# Patient Record
Sex: Female | Born: 1955
Health system: Southern US, Community
[De-identification: ages and names within clinical notes are randomized; demographics above are authoritative.]

## PROBLEM LIST (undated history)

## (undated) DIAGNOSIS — K602 Anal fissure, unspecified: Secondary | ICD-10-CM

## (undated) DIAGNOSIS — I10 Essential (primary) hypertension: Secondary | ICD-10-CM

## (undated) DIAGNOSIS — D219 Benign neoplasm of connective and other soft tissue, unspecified: Secondary | ICD-10-CM

## (undated) DIAGNOSIS — D649 Anemia, unspecified: Secondary | ICD-10-CM

## (undated) DIAGNOSIS — R928 Other abnormal and inconclusive findings on diagnostic imaging of breast: Secondary | ICD-10-CM

## (undated) HISTORY — DX: Anal fissure, unspecified: K60.2

## (undated) HISTORY — DX: Benign neoplasm of connective and other soft tissue, unspecified: D21.9

## (undated) HISTORY — DX: Essential (primary) hypertension: I10

## (undated) HISTORY — PX: DILATION AND CURETTAGE OF UTERUS: SHX78

## (undated) HISTORY — DX: Anemia, unspecified: D64.9

## (undated) HISTORY — DX: Other abnormal and inconclusive findings on diagnostic imaging of breast: R92.8

---

## 1999-06-05 ENCOUNTER — Encounter: Payer: Self-pay | Admitting: Internal Medicine

## 1999-06-05 ENCOUNTER — Ambulatory Visit (HOSPITAL_COMMUNITY): Admission: RE | Admit: 1999-06-05 | Discharge: 1999-06-05 | Payer: Self-pay | Admitting: Internal Medicine

## 1999-08-13 ENCOUNTER — Other Ambulatory Visit: Admission: RE | Admit: 1999-08-13 | Discharge: 1999-08-13 | Payer: Self-pay | Admitting: Gynecology

## 1999-08-14 ENCOUNTER — Other Ambulatory Visit: Admission: RE | Admit: 1999-08-14 | Discharge: 1999-08-14 | Payer: Self-pay | Admitting: Gynecology

## 1999-08-14 ENCOUNTER — Encounter (INDEPENDENT_AMBULATORY_CARE_PROVIDER_SITE_OTHER): Payer: Self-pay | Admitting: Specialist

## 2003-08-31 ENCOUNTER — Other Ambulatory Visit: Admission: RE | Admit: 2003-08-31 | Discharge: 2003-08-31 | Payer: Self-pay | Admitting: Obstetrics and Gynecology

## 2003-09-19 ENCOUNTER — Encounter: Admission: RE | Admit: 2003-09-19 | Discharge: 2003-09-19 | Payer: Self-pay | Admitting: Obstetrics and Gynecology

## 2004-09-19 ENCOUNTER — Other Ambulatory Visit: Admission: RE | Admit: 2004-09-19 | Discharge: 2004-09-19 | Payer: Self-pay | Admitting: Obstetrics and Gynecology

## 2004-10-15 ENCOUNTER — Encounter: Admission: RE | Admit: 2004-10-15 | Discharge: 2004-10-15 | Payer: Self-pay | Admitting: Obstetrics and Gynecology

## 2005-08-27 ENCOUNTER — Other Ambulatory Visit: Admission: RE | Admit: 2005-08-27 | Discharge: 2005-08-27 | Payer: Self-pay | Admitting: Obstetrics and Gynecology

## 2005-10-16 ENCOUNTER — Encounter: Admission: RE | Admit: 2005-10-16 | Discharge: 2005-10-16 | Payer: Self-pay | Admitting: Obstetrics and Gynecology

## 2005-11-21 ENCOUNTER — Encounter: Admission: RE | Admit: 2005-11-21 | Discharge: 2005-11-21 | Payer: Self-pay | Admitting: Obstetrics and Gynecology

## 2006-06-19 ENCOUNTER — Other Ambulatory Visit: Admission: RE | Admit: 2006-06-19 | Discharge: 2006-06-19 | Payer: Self-pay | Admitting: Obstetrics and Gynecology

## 2006-12-08 ENCOUNTER — Encounter: Admission: RE | Admit: 2006-12-08 | Discharge: 2006-12-08 | Payer: Self-pay | Admitting: Obstetrics and Gynecology

## 2008-09-22 ENCOUNTER — Encounter: Admission: RE | Admit: 2008-09-22 | Discharge: 2008-09-22 | Payer: Self-pay | Admitting: Obstetrics and Gynecology

## 2010-10-04 ENCOUNTER — Encounter: Admission: RE | Admit: 2010-10-04 | Discharge: 2010-10-04 | Payer: Self-pay | Admitting: Obstetrics and Gynecology

## 2010-12-09 ENCOUNTER — Encounter: Payer: Self-pay | Admitting: Obstetrics and Gynecology

## 2011-10-28 ENCOUNTER — Other Ambulatory Visit: Payer: Self-pay | Admitting: Obstetrics and Gynecology

## 2011-10-28 DIAGNOSIS — Z1231 Encounter for screening mammogram for malignant neoplasm of breast: Secondary | ICD-10-CM

## 2011-11-22 ENCOUNTER — Ambulatory Visit
Admission: RE | Admit: 2011-11-22 | Discharge: 2011-11-22 | Disposition: A | Discharge status: Home / Self Care | Payer: 59 | Source: Ambulatory Visit | Attending: Obstetrics and Gynecology | Admitting: Obstetrics and Gynecology

## 2011-11-22 DIAGNOSIS — Z1231 Encounter for screening mammogram for malignant neoplasm of breast: Secondary | ICD-10-CM

## 2012-10-27 ENCOUNTER — Ambulatory Visit: Payer: Self-pay | Admitting: Obstetrics and Gynecology

## 2012-11-24 ENCOUNTER — Encounter: Payer: Self-pay | Admitting: Sports Medicine

## 2012-11-24 ENCOUNTER — Ambulatory Visit (INDEPENDENT_AMBULATORY_CARE_PROVIDER_SITE_OTHER): Payer: 59 | Admitting: Sports Medicine

## 2012-11-24 VITALS — BP 133/97 | HR 84 | Ht 66.0 in | Wt 200.0 lb

## 2012-11-24 DIAGNOSIS — M7711 Lateral epicondylitis, right elbow: Secondary | ICD-10-CM | POA: Insufficient documentation

## 2012-11-24 DIAGNOSIS — M7710 Lateral epicondylitis, unspecified elbow: Secondary | ICD-10-CM

## 2012-11-24 DIAGNOSIS — M778 Other enthesopathies, not elsewhere classified: Secondary | ICD-10-CM | POA: Insufficient documentation

## 2012-11-24 DIAGNOSIS — M65839 Other synovitis and tenosynovitis, unspecified forearm: Secondary | ICD-10-CM

## 2012-11-24 NOTE — Patient Instructions (Addendum)
Do the exercises we discussed 3 times per day. Wear your wrist splint for an hour 3 times per day and at any time you are doing something involving a lot of wrist activity (such as tennis). Come back to see [place] in 6 weeks.

## 2012-11-24 NOTE — Progress Notes (Signed)
Patient ID: Lauren Vazquez, female   DOB: 09/01/1956, 57 y.o.   MRN: [id number] Subjective: The patient is a 57 y.o. year old female who presents today for intermittent right wrist and elbow pain.  Patient reports that she has had problems with an achy right wrist pain that is made worse with rotational movements for several months now. She does not remember any specific injury. The pain is episodic. Painful episodes can last from hours to a day or 2. Pain is bad enough that she will not use her right hand during the episodes. Episodes are not brought on by any particular activity. In between episodes the patient is completely asymptomatic.  The patient also has intermittent problems with right elbow pain. These appear to be related to playing tennis. Pain is located in the right lateral elbow. It is made worse by playing tennis and better with rest. It is not constant. It is not bothering her today.  Patient's past medical, social, and family history were reviewed and updated as appropriate. History  Substance Use Topics  . Smoking status: Never Smoker   . Smokeless tobacco: Never Used  . Alcohol Use: No   Objective:  Filed Vitals:   11/24/12 0856  BP: 133/97  Pulse: 84   Ext: Slight swelling in the vicinity of the right wrist.  Patient has 5/5 strength flexion, extension, and rotation with no pain present with any movement.  No bony deformities or point tenderness. Full ROM of the wrist.  MSK [place]: There is accessory tendon in the first compartment of the right wrist. There is some edema in the tendon sheath surrounding this. There is some edema in the second compartment of the right wrist. The remainder of the compartments of the right wrist are relatively normal. Patient is also noted to have a bone spur on the right lateral epicondyles with no significant edema.  Assessment/Plan: Intersection syndrome. We will begin treatment with ice, topical aspirin, rehabilitation exercises, and  compression. Patient return to clinic in 6 weeks for repeat ultrasound.  Please also see individual problems in problem list for problem-specific plans.

## 2012-11-24 NOTE — Assessment & Plan Note (Signed)
Exercises for wrist may help this  We will check this on RTC

## 2012-11-24 NOTE — Assessment & Plan Note (Signed)
HEP  Wrist loop  Icing at night prn  Rehab as noted

## 2012-12-21 ENCOUNTER — Ambulatory Visit: Payer: 59 | Admitting: Obstetrics and Gynecology

## 2012-12-21 ENCOUNTER — Encounter: Payer: Self-pay | Admitting: Obstetrics and Gynecology

## 2012-12-21 VITALS — BP 130/74 | HR 76 | Ht 66.0 in | Wt 203.0 lb

## 2012-12-21 DIAGNOSIS — K625 Hemorrhage of anus and rectum: Secondary | ICD-10-CM

## 2012-12-21 DIAGNOSIS — D219 Benign neoplasm of connective and other soft tissue, unspecified: Secondary | ICD-10-CM

## 2012-12-21 DIAGNOSIS — Z124 Encounter for screening for malignant neoplasm of cervix: Secondary | ICD-10-CM

## 2012-12-21 NOTE — Progress Notes (Signed)
Subjective:  aex  Last Pap: 08/21/09, due today per last note WNL: Yes Regular Periods:no Contraception: post menopausal   Monthly Breast exam:no Tetanus<64yrs:yes Nl.Bladder Function:yes Daily BMs:yes Healthy Diet:no Calcium:no, in multivitamin  Mammogram:yes Date of Mammogram: 11/22/11 scheduled for 12/2012 Exercise:yes Have often Exercise: 1-2 times per week  Seatbelt: yes Abuse at home: no Stressful work:no Sigmoid-colonoscopy: 1-2 years ago per pt Bone Density: No PCP: Dr. Nicholos Johns Change in PMH: no changes  Change in [facility]: mother with breast cancer  Lauren Vazquez is a 57 y.o. female G0P0000 who presents for annual exam.  The patient has no complaints today.   The following portions of the patient's history were reviewed and updated as appropriate: allergies, current medications, past family history, past medical history, past social history, past surgical history and problem list.  Review of Systems Pertinent items are noted in HPI. Gastrointestinal:No change in bowel habits, no abdominal pain, has intermittent and unpredictable  rectal bleeding associated with know rectal fissures Genitourinary:negative for dysuria, frequency, hematuria, nocturia and urinary incontinence    Objective:     BP 130/74  Pulse 76  Ht 5\' 6"  (1.676 m)  Wt 203 lb (92.08 kg)  BMI 32.76 kg/m2  Weight:  Wt Readings from Last 1 Encounters:  12/21/12 203 lb (92.08 kg)     BMI: Body mass index is 32.76 kg/(m^2). General Appearance: Alert, appropriate appearance for age. No acute distress HEENT: Grossly normal Neck / Thyroid: Supple, no masses, nodes or enlargement Lungs: clear to auscultation bilaterally Back: No CVA tenderness Breast Exam: No masses or nodes.No dimpling, nipple retraction or discharge. Cardiovascular: Regular rate and rhythm. S1, S2, no murmur Gastrointestinal: Soft, non-tender, no masses or organomegaly Pelvic Exam: External genitalia: normal general appearance Vaginal:  atrophic mucosa Cervix: exocervical contact bleeding Adnexa: non palpable Uterus: cannot detect level of enlargement  Exam limited by body habitus Rectovaginal: normal rectal, no masses Lymphatic Exam: Non-palpable nodes in neck, clavicular, axillary, or inguinal regions Skin: no rash or abnormalities Neurologic: Normal gait and speech, no tremor  Psychiatric: Alert and oriented, appropriate affect.    Urinalysis:Not done    Assessment:    Uterine fibroids, asymptomatic, but not palpable    Plan:   mammogram pap smear return annually or prn U/S with visit to F/U fibroids    Dierdre Forth MD

## 2012-12-22 LAB — PAP IG AND HPV HIGH-RISK: HPV DNA High Risk: NOT DETECTED

## 2013-01-06 ENCOUNTER — Other Ambulatory Visit: Payer: 59

## 2013-01-06 ENCOUNTER — Encounter: Payer: 59 | Admitting: Obstetrics and Gynecology

## 2013-01-12 ENCOUNTER — Ambulatory Visit (INDEPENDENT_AMBULATORY_CARE_PROVIDER_SITE_OTHER): Payer: 59 | Admitting: Sports Medicine

## 2013-01-12 ENCOUNTER — Encounter: Payer: Self-pay | Admitting: Sports Medicine

## 2013-01-12 VITALS — BP 128/83 | HR 79 | Ht 66.0 in | Wt 203.0 lb

## 2013-01-12 DIAGNOSIS — M778 Other enthesopathies, not elsewhere classified: Secondary | ICD-10-CM

## 2013-01-12 DIAGNOSIS — M65849 Other synovitis and tenosynovitis, unspecified hand: Secondary | ICD-10-CM

## 2013-01-12 DIAGNOSIS — M65839 Other synovitis and tenosynovitis, unspecified forearm: Secondary | ICD-10-CM

## 2013-01-12 NOTE — Progress Notes (Signed)
Subjective:    Lauren Vazquez is an 57 y.o. female who presents for follow up for wrist pain  Wrist pain is 10% improved. Wears the wrist strap for tennis and w/ increased activity. No decrease in tennis level (1-2-x wkly). Not using the aspirin cream. Pain is primarily at night not after exercises. Home exercises 3-4x wkly. Denies numbness tingling and loss of strength in hand. Pt is R handed.   The following portions of the patient's history were reviewed and updated as appropriate: allergies, current medications and problem list.  Review of Systems Pertinent items are noted in HPI.   Objective:    BP 128/83  Pulse 79  Ht 5\' 6"  (1.676 m)  Wt 203 lb (92.08 kg)  BMI 32.78 kg/m2 Gen: NAD, WNWD HEENT: EOMI, MMM Skin: warm, well perfused, intact, no rash Neuro: CN grossly intact, cerebellar fxn normal MSK: wrist and hand FROM, non-ttp. Grip strength 3+ bilat. No pain on flexion, extension, radial deviation, ulnar deviation against resistance. Incrased puffiness of the R wrist over the areas of the 1st and 2nd compartments. Opposition of the thumb w/o difficulty  Imaging: none  Assessment:   R wrist Intersection syndrome - improving  Plan:   Continue to use the brace w/ increased activity and during tennis Start using the brace for 1-2 hours after tennis and at night as needed. Continue w/ rest and ice PRN, but may continue normal daily activities. Recommend checking TSH if not previously done as low thyroid levels associated w/ syndrome. Continue wrist exercises F/u in 6 wks if not improving and consider [place]    Cc: Dr. Elias Else, MD

## 2013-01-12 NOTE — Assessment & Plan Note (Signed)
Note gradual improvement with exercises and compression for wrist  We will recheck if this does not steadily resolve  Not very symptomatic at this point

## 2014-04-04 ENCOUNTER — Other Ambulatory Visit: Payer: Self-pay

## 2014-04-04 DIAGNOSIS — Z1231 Encounter for screening mammogram for malignant neoplasm of breast: Secondary | ICD-10-CM

## 2014-04-06 ENCOUNTER — Ambulatory Visit
Admission: RE | Admit: 2014-04-06 | Discharge: 2014-04-06 | Disposition: A | Discharge status: Home / Self Care | Payer: 59 | Source: Ambulatory Visit

## 2014-04-06 DIAGNOSIS — Z1231 Encounter for screening mammogram for malignant neoplasm of breast: Secondary | ICD-10-CM

## 2015-03-15 ENCOUNTER — Other Ambulatory Visit: Payer: Self-pay

## 2015-03-15 DIAGNOSIS — Z1231 Encounter for screening mammogram for malignant neoplasm of breast: Secondary | ICD-10-CM

## 2015-03-23 ENCOUNTER — Ambulatory Visit (INDEPENDENT_AMBULATORY_CARE_PROVIDER_SITE_OTHER): Payer: 59 | Admitting: Sports Medicine

## 2015-03-23 ENCOUNTER — Encounter: Payer: Self-pay | Admitting: Sports Medicine

## 2015-03-23 VITALS — BP 138/82 | Ht 66.0 in | Wt 196.0 lb

## 2015-03-23 DIAGNOSIS — M25561 Pain in right knee: Secondary | ICD-10-CM | POA: Insufficient documentation

## 2015-03-23 DIAGNOSIS — M25562 Pain in left knee: Secondary | ICD-10-CM

## 2015-03-23 NOTE — Progress Notes (Signed)
  Lauren Vazquez - 59 y.o. female MRN [id number]  Date of birth: [birth year]  SUBJECTIVE: CC: 1.  left knee pain, initial evaluation      HPI:   one month of intermittent generalized knee pain especially while playing tennis or going up and down steps.  Reports instability of the patella without any overt giving way, locking or buckling.  No significant pain with going from sitting to standing  Is on a steroid dose pack, day 6, for right-sided sciatica reports some improvement with the left knee pain       ROS:  per HPI    HISTORY:  Past Medical, Surgical, Social, and Family History reviewed & updated per EMR.  Pertinent Historical Findings include: Social History   Occupational History  . Not on file.   Social History Main Topics  . Smoking status: Never Smoker   . Smokeless tobacco: Never Used  . Alcohol Use: No  . Drug Use: No  . Sexual Activity: Not on file   OBJECTIVE:  VS:   HT:5\' 6"  (167.6 cm)   WT:196 lb (88.905 kg)  BMI:31.7          BP:138/82 mmHg  HR: bpm  TEMP: ( )  RESP:   PHYSICAL EXAM: GENERAL:  adult Caucasian female. No acute distress  PSYCH: Alert and appropriately interactive.  SKIN: No open skin lesions or abnormal skin markings on areas inspected as below  VASCULAR:  bilateral DP and PT pulses 2+/4. No significant pretibial edema.   NEURO:  sensation is intact to light touch in left lower extremity.   Left Knee Exam: Appearance: normal alignment, Normal Contours  Skin: No overlying erythema/ecchymosis.  Palpation: No to trace effusion Patellar Grind: Normal Medial Joint Line: Normal Lateral Joint Line: Normal No focal TTP  Strength, ROM & OtherTests: Varus/Valgus Strain: Normal Anterior/Posterior Drawer: Normal Meniscal Testing: Normal Strength: Extensor mechanism 5+/5, hip abduction strength 5+/5.     DATA OBTAINED: No notes on file   Limited MSK Ultrasound of Left Kee: Findings: Patella & Patellar Tendon: Normal Quad & Quad  Tendon: Normal Suprapatellar Pouch: physiology fluid Medial Joint Line: Normal Lateral Joint Line: Normal Posterior Knee: n/a  Impression: The above findings are consistent with normal knee exam    ASSESSMENT & PLAN: See problem based charting & AVS for additional documentation Problem List Items Addressed This Visit    Left knee pain - Primary    Generalized discomfort without focal exam findings. No evidence of significant degenerative meniscal tear or significant arthritis on ultrasound. Patient is on day 6 of a steroid dose pack so exam may be less revealing but will start with conservative treatment. Patient requested Body Helix knee compression sleeve.  Discussed using during activity as well as for the first 2 hours following activity and PRN. HEP: Box steps, forward, backwards and laterally.        FOLLOW UP:  Return if symptoms worsen or fail to improve.

## 2015-03-23 NOTE — Assessment & Plan Note (Signed)
Generalized discomfort without focal exam findings. No evidence of significant degenerative meniscal tear or significant arthritis on ultrasound. Patient is on day 6 of a steroid dose pack so exam may be less revealing but will start with conservative treatment. Patient requested Body Helix knee compression sleeve.  Discussed using during activity as well as for the first 2 hours following activity and PRN. HEP: Box steps, forward, backwards and laterally.

## 2015-04-10 ENCOUNTER — Ambulatory Visit
Admission: RE | Admit: 2015-04-10 | Discharge: 2015-04-10 | Disposition: A | Discharge status: Home / Self Care | Payer: 59 | Source: Ambulatory Visit

## 2015-04-10 DIAGNOSIS — Z1231 Encounter for screening mammogram for malignant neoplasm of breast: Secondary | ICD-10-CM

## 2016-07-10 ENCOUNTER — Other Ambulatory Visit: Payer: Self-pay | Admitting: Obstetrics and Gynecology

## 2016-07-10 DIAGNOSIS — Z1231 Encounter for screening mammogram for malignant neoplasm of breast: Secondary | ICD-10-CM

## 2016-07-17 ENCOUNTER — Ambulatory Visit
Admission: RE | Admit: 2016-07-17 | Discharge: 2016-07-17 | Disposition: A | Discharge status: Home / Self Care | Payer: 59 | Source: Ambulatory Visit | Attending: Obstetrics and Gynecology | Admitting: Obstetrics and Gynecology

## 2016-07-17 DIAGNOSIS — Z1231 Encounter for screening mammogram for malignant neoplasm of breast: Secondary | ICD-10-CM

## 2016-11-27 DIAGNOSIS — E782 Mixed hyperlipidemia: Secondary | ICD-10-CM | POA: Diagnosis not present

## 2016-11-27 DIAGNOSIS — I10 Essential (primary) hypertension: Secondary | ICD-10-CM | POA: Diagnosis not present

## 2017-05-19 DIAGNOSIS — N952 Postmenopausal atrophic vaginitis: Secondary | ICD-10-CM | POA: Diagnosis not present

## 2017-05-19 DIAGNOSIS — Z01411 Encounter for gynecological examination (general) (routine) with abnormal findings: Secondary | ICD-10-CM | POA: Diagnosis not present

## 2017-05-29 DIAGNOSIS — H2513 Age-related nuclear cataract, bilateral: Secondary | ICD-10-CM | POA: Diagnosis not present

## 2017-05-29 DIAGNOSIS — H40053 Ocular hypertension, bilateral: Secondary | ICD-10-CM | POA: Diagnosis not present

## 2017-06-26 DIAGNOSIS — D225 Melanocytic nevi of trunk: Secondary | ICD-10-CM | POA: Diagnosis not present

## 2017-06-26 DIAGNOSIS — L718 Other rosacea: Secondary | ICD-10-CM | POA: Diagnosis not present

## 2017-06-26 DIAGNOSIS — Z85828 Personal history of other malignant neoplasm of skin: Secondary | ICD-10-CM | POA: Diagnosis not present

## 2017-07-07 DIAGNOSIS — I10 Essential (primary) hypertension: Secondary | ICD-10-CM | POA: Diagnosis not present

## 2017-07-07 DIAGNOSIS — Z0000 Encounter for general adult medical examination without abnormal findings: Secondary | ICD-10-CM | POA: Diagnosis not present

## 2017-07-07 DIAGNOSIS — R05 Cough: Secondary | ICD-10-CM | POA: Diagnosis not present

## 2017-07-07 DIAGNOSIS — E782 Mixed hyperlipidemia: Secondary | ICD-10-CM | POA: Diagnosis not present

## 2017-07-25 DIAGNOSIS — N289 Disorder of kidney and ureter, unspecified: Secondary | ICD-10-CM | POA: Diagnosis not present

## 2017-10-23 ENCOUNTER — Ambulatory Visit: Payer: 59 | Admitting: Sports Medicine

## 2017-10-23 ENCOUNTER — Encounter: Payer: Self-pay | Admitting: Sports Medicine

## 2017-10-23 DIAGNOSIS — M2550 Pain in unspecified joint: Secondary | ICD-10-CM | POA: Diagnosis not present

## 2017-10-23 DIAGNOSIS — M25531 Pain in right wrist: Secondary | ICD-10-CM | POA: Diagnosis not present

## 2017-10-23 DIAGNOSIS — M7751 Other enthesopathy of right foot: Secondary | ICD-10-CM

## 2017-10-23 DIAGNOSIS — M25562 Pain in left knee: Secondary | ICD-10-CM | POA: Diagnosis not present

## 2017-10-23 DIAGNOSIS — M25561 Pain in right knee: Secondary | ICD-10-CM | POA: Diagnosis not present

## 2017-10-23 MED ORDER — AMITRIPTYLINE HCL 25 MG PO TABS: 25.0000 mg | ORAL_TABLET | Freq: Every day | ORAL | 2 refills | Status: DC

## 2017-10-23 NOTE — Assessment & Plan Note (Signed)
Symptoms appear to be relatively benign on exam.  Some joint stiffness noted.  Possibly related to some of the other arthralgias she is complaining of today. -We will continue to monitor as this may be signs of a myofascial syndrome. -OTC NSAIDs as needed.

## 2017-10-23 NOTE — Assessment & Plan Note (Signed)
See plan above. -OTC NSAIDs as needed. -Continue staying active.

## 2017-10-23 NOTE — Assessment & Plan Note (Signed)
Patient is complaining of numerous sites of joint pain.  There is some concern about possible myofascial syndrome versus arthralgia of many sites. -Amitriptyline 25 mg q. nightly over the next month.

## 2017-10-23 NOTE — Assessment & Plan Note (Signed)
Patient is showing signs and symptoms of beginning stages of adhesive capsulitis of the right ankle.  Passive range of motion significantly reduced.  Discussed this ailment with the patient. -Encouraged continued use and exercise -Compressive sleeve provided today. -OTC NSAIDs as needed

## 2017-10-23 NOTE — Progress Notes (Signed)
HPI  CC: Right hand pain, right ankle pain, bilateral knee pain Patient is a very pleasant 61 year old female presents today with hand ankle and bilateral knee pain.  Regarding her bilateral knees, she states that she has been having soreness and stiffness within her knees bilaterally.  It seems to be worse on the right side than the left.  She endorses some discomfort with going down stairs.  Occasional events in which her knees feel like they are going to "give out".  She denies any weakness, numbness, or paresthesias.  No trauma, injury, or fall which may have caused this discomfort.  Pain has been gradual onset and seems to come and go.  Regarding patient's right ankle pain she states that she has been dealing with this for many months.  Her ankle will occasionally "twist" causing acute pain but then quickly subsides over the course of an hour or 2.  She states that she does not feel as though the ankle is unstable but she has had numerous injuries in the past.  She denies any weakness, numbness, or paresthesias.  No recent injury that caused significant pain for greater than one afternoon.  Patient is able to walk without a limp.  The patient's right wrist pain seems to be the most mild of all of her symptoms.  She states that she has some occasional soreness within the wrist itself as well as some associated stiffness.  She sometimes has issues opening jars.  The pain is localized to the dorsal and radial aspect of the wrist, and does not seem to inhibit her from performing many of her activities.  No numbness, weakness, or paresthesias.  No previous injuries or trauma.  Medications/Interventions Tried: Occasional NSAIDs  Review of systems all negative unless stated above.   No family history of myofascial syndromes Non-smoker  Objective: BP (!) 149/76   Ht 5\' 5"  (1.651 m)   Wt 200 lb (90.7 kg)   BMI 33.28 kg/m  Gen: Right-Hand Dominant. NAD, well groomed, a/o x3, normal affect.  CV:  Well-perfused. Warm.  Resp: Non-labored.  Neuro: Sensation intact throughout. No gross coordination deficits.  Gait: Nonpathologic posture, unremarkable stride without signs of limp or balance issues. Wrist, Right: TTP noted at the dorsal and radial wrist. Inspection yielded no erythema, ecchymosis, bony deformity, or swelling. ROM full with good flexion and extension and ulnar/radial deviation >> however upon initial testing this was reduced, only to quickly normalize on secondary testing. Palpation is normal over metacarpals, scaphoid, lunate, and TFCC; tendons without tenderness/swelling. Strength 5/5 in all directions without pain. Negative Finkelstein, tinel's and phalens. Knee, Bilateral: TTP noted at the inferior aspect of the patella is bilateral. Inspection was negative for erythema, ecchymosis, and effusion. No obvious bony abnormalities or signs of osteophyte development. Palpation yielded no asymmetric warmth; No joint line tenderness; No condyle tenderness; No patellar tenderness; +1 patellar crepitus bilaterally. Patellar and quadriceps tendons unremarkable, and no tenderness of the pes anserine bursa. No obvious Baker's cyst development. ROM 0-130 on the left, and ~5-130 on the right. Normal hamstring and quadriceps strength. Neurovascularly intact bilaterally.  Hip abductors with good strength bilaterally.  - Ligaments: (Solid and consistent endpoints)   - ACL (present bilaterally)   - PCL (present bilaterally)   - LCL (present bilaterally)   - MCL (present bilaterally).   - Additional tests performed:    - Anterior Drawer >> NEG   - Lachman >> NEG  - Patella:   - Patellar grind/compression: NEG   -  Patellar glide: Without apprehension Ankle/Foot, Right: No TTP noted throughout. No visible erythema, swelling, ecchymosis, or bony deformity. No notable pes planus/cavus deformity. Transverse arch grossly intact; No evidence of tibiotalar deviation; Passive ROM is greatly reduced with  dorsiflexion, plantarflexion, inversion, eversion, and talar tilt.  Strength is 5/5 in all directions. No tenderness at the insertion/body/myotendinous junction of the Achilles tendon; No peroneal tendon tenderness or subluxation; No tenderness on posterior aspects of lateral and medial malleolus; Stable lateral and medial ligaments; Talar dome nontender; No tenderness over the navicular prominence; No tenderness over cuboid; No pain at base of 5th MT; No tenderness at the distal metatarsals; Able to walk 4 steps.    Assessment and plan:  Adhesive capsulitis of ankle, right Patient is showing signs and symptoms of beginning stages of adhesive capsulitis of the right ankle.  Passive range of motion significantly reduced.  Discussed this ailment with the patient. -Encouraged continued use and exercise -Compressive sleeve provided today. -OTC NSAIDs as needed  Arthralgia of multiple joints Patient is complaining of numerous sites of joint pain.  There is some concern about possible myofascial syndrome versus arthralgia of many sites. -Amitriptyline 25 mg q. nightly over the next month.  Wrist pain, right Symptoms appear to be relatively benign on exam.  Some joint stiffness noted.  Possibly related to some of the other arthralgias she is complaining of today. -We will continue to monitor as this may be signs of a myofascial syndrome. -OTC NSAIDs as needed.  Bilateral knee pain See plan above. -OTC NSAIDs as needed. -Continue staying active.   Meds ordered this encounter  Medications  . amitriptyline (ELAVIL) 25 MG tablet    Sig: Take 1 tablet (25 mg total) by mouth at bedtime.    Dispense:  30 tablet    Refill:  2     Elberta Leatherwood, MD,MS [facility] 10/23/2017 1:29 PM

## 2017-11-21 DIAGNOSIS — N183 Chronic kidney disease, stage 3 (moderate): Secondary | ICD-10-CM | POA: Diagnosis not present

## 2017-11-24 ENCOUNTER — Other Ambulatory Visit: Payer: Self-pay | Admitting: Obstetrics and Gynecology

## 2017-11-24 DIAGNOSIS — Z1231 Encounter for screening mammogram for malignant neoplasm of breast: Secondary | ICD-10-CM

## 2017-11-27 ENCOUNTER — Encounter: Payer: Self-pay | Admitting: Sports Medicine

## 2017-11-27 ENCOUNTER — Ambulatory Visit: Payer: 59 | Admitting: Sports Medicine

## 2017-11-27 DIAGNOSIS — M25561 Pain in right knee: Secondary | ICD-10-CM

## 2017-11-27 DIAGNOSIS — M7751 Other enthesopathy of right foot: Secondary | ICD-10-CM

## 2017-11-27 DIAGNOSIS — M25531 Pain in right wrist: Secondary | ICD-10-CM

## 2017-11-27 DIAGNOSIS — M25562 Pain in left knee: Secondary | ICD-10-CM

## 2017-11-27 NOTE — Progress Notes (Signed)
HPI  CC: Bilateral knee, right ankle, and right hand pain Patient is here to follow-up regarding her knee, ankle, and hand pain.  She endorses symptomatic improvement since last visit.  The compression sleeve for her ankle she was given at the last visit has provided significant symptomatic improvement, however she endorses persistent stiffness of this ankle.  She states that she has not had a good response to the amitriptyline provided for her right wrist pain and multiple joint pain.  She endorses having strange and new dreams which she did not have any dreams prior to beginning taking this medication.  Patient's knee pain is described as generalized soreness.  She states that this is likely because she has been active playing tennis for multiple days straight.  She states that much of this knee pain is tolerable but she would like to discuss some strengthening exercises she may benefit from long-term.  Patient denies any new or recent trauma, injury, or fall.  No new weakness, numbness, or paresthesias (however her right wrist pain does involve some paresthesia discomfort).  Medications/Interventions Tried: Compression sleeve, OTC NSAIDs, exercise, amitriptyline.  See HPI and/or previous note for associated ROS.  Objective: BP 134/75   Ht 5\' 6"  (1.676 m)   Wt 200 lb (90.7 kg)   BMI 32.28 kg/m  Gen: Right-Hand Dominant. NAD, well groomed, a/o x3, normal affect.  CV: Well-perfused. Warm.  Resp: Non-labored.  Neuro: Sensation intact throughout. No gross coordination deficits.  Gait: Nonpathologic posture, unremarkable stride without signs of limp or balance issues. Wrist, Right: Minimal TTP at the dorsal and radial aspect. Inspection yielded no erythema, ecchymosis, bony deformity, or swelling. ROM full with good flexion and extension and ulnar/radial deviation. Palpation is normal over metacarpals, scaphoid, lunate, and TFCC; tendons without tenderness/swelling. Strength 5/5 in all  directions without pain. Negative Finkelstein, tinel's and phalens. Knee, Bilateral: TTP noted at the inferior aspect of the patella is bilateral, w/ reported "fullness" posteriorly. Inspection was negative for erythema, ecchymosis, and effusion. No joint line tenderness; +1 patellar crepitus bilaterally. Patellar and quadriceps tendons unremarkable. No obvious Baker's cyst development. ROM 0-130 on the left, and 0-120 on the right. Normal hamstring and quadriceps strength. Neurovascularly intact bilaterally.  Hip abductors with good strength bilaterally.             - Ligaments: (Solid and consistent endpoints)                         - ACL (present bilaterally)                         - PCL (present bilaterally)                         - LCL (present bilaterally)                         - MCL (present bilaterally).  Ankle/Foot, Right: No TTP noted throughout. No visible erythema, swelling, ecchymosis, or bony deformity. No notable pes planus/cavus deformity. Passive ROM is greatly reduced with dorsiflexion (~90), plantarflexion (~120), inversion, eversion, and talar tilt.  Strength is 5/5 in all directions. Talar dome nontender; No tenderness at the distal metatarsals; Able to walk 4 steps.   Assessment and plan:  Adhesive capsulitis of ankle, right Improving symptoms with gradually improving range of motion. -Encouraged staying active. -Continue compressive sleeve. -Ankle stretching  and strengthening exercises. -Ice, heat, and over-the-counter NSAIDs as needed. -We will continue to monitor.  Bilateral knee pain No red flag symptoms or mechanical symptoms.  Some soreness endorsed but patient denies any inhibition of desired activities. -Quadricep strengthening exercises provided today (including single leg exercises). -Ice, heat, and OTC NSAIDs as needed. -Continue staying active using pain as a guide.  Wrist pain, right Discomfort is persistent and unchanged from previous.  No benefit  from amitriptyline.  Denies muscle weakness or severe numbness.  No radiation of symptoms.  Patient states that symptoms are tolerable at this time. -Discontinue amitriptyline >> patient had poor reaction and did not want to continue its use. -Use pain as guide. -Be aware of activities that exacerbate symptoms. -Follow-up in 6 weeks.   Elberta Leatherwood, MD,MS [facility] 11/27/2017 4:55 PM

## 2017-11-27 NOTE — Assessment & Plan Note (Signed)
Improving symptoms with gradually improving range of motion. -Encouraged staying active. -Continue compressive sleeve. -Ankle stretching and strengthening exercises. -Ice, heat, and over-the-counter NSAIDs as needed. -We will continue to monitor.

## 2017-11-27 NOTE — Assessment & Plan Note (Signed)
Discomfort is persistent and unchanged from previous.  No benefit from amitriptyline.  Denies muscle weakness or severe numbness.  No radiation of symptoms.  Patient states that symptoms are tolerable at this time. -Discontinue amitriptyline >> patient had poor reaction and did not want to continue its use. -Use pain as guide. -Be aware of activities that exacerbate symptoms. -Follow-up in 6 weeks.

## 2017-11-27 NOTE — Assessment & Plan Note (Signed)
No red flag symptoms or mechanical symptoms.  Some soreness endorsed but patient denies any inhibition of desired activities. -Quadricep strengthening exercises provided today (including single leg exercises). -Ice, heat, and OTC NSAIDs as needed. -Continue staying active using pain as a guide.

## 2017-12-16 ENCOUNTER — Ambulatory Visit
Admission: RE | Admit: 2017-12-16 | Discharge: 2017-12-16 | Disposition: A | Discharge status: Home / Self Care | Payer: 59 | Source: Ambulatory Visit | Attending: Obstetrics and Gynecology | Admitting: Obstetrics and Gynecology

## 2017-12-16 DIAGNOSIS — Z1231 Encounter for screening mammogram for malignant neoplasm of breast: Secondary | ICD-10-CM

## 2018-01-07 DIAGNOSIS — N183 Chronic kidney disease, stage 3 (moderate): Secondary | ICD-10-CM | POA: Diagnosis not present

## 2018-01-08 ENCOUNTER — Ambulatory Visit: Payer: 59 | Admitting: Sports Medicine

## 2018-01-08 ENCOUNTER — Encounter: Payer: Self-pay | Admitting: Sports Medicine

## 2018-01-08 DIAGNOSIS — M25561 Pain in right knee: Secondary | ICD-10-CM | POA: Diagnosis not present

## 2018-01-08 DIAGNOSIS — M25562 Pain in left knee: Secondary | ICD-10-CM | POA: Diagnosis not present

## 2018-01-08 DIAGNOSIS — M25531 Pain in right wrist: Secondary | ICD-10-CM

## 2018-01-08 DIAGNOSIS — M2550 Pain in unspecified joint: Secondary | ICD-10-CM

## 2018-01-08 DIAGNOSIS — M7751 Other enthesopathy of right foot: Secondary | ICD-10-CM

## 2018-01-08 NOTE — Progress Notes (Signed)
   HPI  CC: Follow-up bilateral knee pain, right ankle pain, and right wrist pain. Patient is here to follow-up regarding her bilateral knee, right ankle, and right wrist pain.  She states that she has been doing significantly better.  Pain is almost completely gone.  She is able to exercise, play tennis, at the level of her desire.  She continues to wear her compressive sleeves which have provided significant improvement.  She denies any recent setbacks.  No new injuries or trauma.  The swelling within her right wrist and right ankle have persisted some but feels significantly better than they had been a couple months ago.  Overall she is very pleased with her progress.  Medications/Interventions Tried: Home exercises, compression sleeves, amitriptyline, anti-inflammatories.  See HPI and/or previous note for associated ROS.  Objective: BP 108/80   Ht 5\' 6"  (1.676 m)   Wt 200 lb (90.7 kg)   BMI 32.28 kg/m  Gen: NAD, well groomed, a/o x3, normal affect.  CV: Well-perfused. Warm.  Resp: Non-labored.  Neuro: Sensation intact throughout. No gross coordination deficits.  Gait: Nonpathologic posture, unremarkable stride without signs of limp or balance issues. Wrist,Right:Minimal TTP. Slight swelling compared to the left on inspection. No erythema, ecchymosis, or bony deformity.ROM full with good flexion and extension and ulnar/radial deviation.Strength 5/5 in all directions without pain.Negative Finkelstein, tinel's and phalens. Knee,Bilateral: Minimal TTP noted atthe bilat inferior aspect of the patella.Inspection was negative forerythema, ecchymosis,and effusion. +1patellar crepitus bilaterally. ROMfull bilaterally.Strength 5/5.Neurovascularly intact bilaterally. - Ligaments:(Solidandconsistent endpoints) - ACL(present bilaterally) - PCL(present bilaterally) - LCL(present  bilaterally) - MCL(present bilaterally). Ankle/Foot,Right:No TTP noted throughout.No visible erythema, swelling, ecchymosis, or bony deformity. PassiveROMis reduced but improved with dorsiflexion (~100), plantarflexion (~160).Strength is 5/5 in all directions. Talar dome nontender; No tenderness at the distal metatarsals   Assessment and plan:  Arthralgia of multiple joints Patient is here to follow-up regarding her bilateral knee right wrist and right ankle pain.  Symptoms have dramatically improved since last visit.  Patient feels that this is likely due to the compression sleeves that she has been compliant with as well as the home exercises.  Physical exam has improved as well.  Although working diagnosis at this time involves age-related degenerative changes, long-term instability reactions, and overuse inflammatory responses the swift global improvement of patient's symptoms as well as the initial symptoms themselves makes one consider a global inflammatory reaction/condition as well within the realm of possibility. -Continue home exercise program -Encouraged persistent activity as tolerated -Continue compression sleeves.  Ice sore joints as needed after exercise. -OTC NSAIDs as needed. -Follow-up as needed  Bilateral knee pain See plan above  Wrist pain, right See plan above  Adhesive capsulitis of ankle, right See plan above   Lauren Leatherwood, MD,MS [place] Fellow 01/08/2018 6:32 PM

## 2018-01-08 NOTE — Assessment & Plan Note (Signed)
Patient is here to follow-up regarding her bilateral knee right wrist and right ankle pain.  Symptoms have dramatically improved since last visit.  Patient feels that this is likely due to the compression sleeves that she has been compliant with as well as the home exercises.  Physical exam has improved as well.  Although working diagnosis at this time involves age-related degenerative changes, long-term instability reactions, and overuse inflammatory responses the swift global improvement of patient's symptoms as well as the initial symptoms themselves makes one consider a global inflammatory reaction/condition as well within the realm of possibility. -Continue home exercise program -Encouraged persistent activity as tolerated -Continue compression sleeves.  Ice sore joints as needed after exercise. -OTC NSAIDs as needed. -Follow-up as needed

## 2018-01-08 NOTE — Assessment & Plan Note (Signed)
See plan above.

## 2018-05-20 DIAGNOSIS — D259 Leiomyoma of uterus, unspecified: Secondary | ICD-10-CM | POA: Diagnosis not present

## 2018-05-20 DIAGNOSIS — Z124 Encounter for screening for malignant neoplasm of cervix: Secondary | ICD-10-CM | POA: Diagnosis not present

## 2018-05-20 DIAGNOSIS — Z6832 Body mass index (BMI) 32.0-32.9, adult: Secondary | ICD-10-CM | POA: Diagnosis not present

## 2018-05-20 DIAGNOSIS — Z01411 Encounter for gynecological examination (general) (routine) with abnormal findings: Secondary | ICD-10-CM | POA: Diagnosis not present

## 2018-07-09 DIAGNOSIS — Z85828 Personal history of other malignant neoplasm of skin: Secondary | ICD-10-CM | POA: Diagnosis not present

## 2018-07-09 DIAGNOSIS — D2262 Melanocytic nevi of left upper limb, including shoulder: Secondary | ICD-10-CM | POA: Diagnosis not present

## 2018-07-09 DIAGNOSIS — D2261 Melanocytic nevi of right upper limb, including shoulder: Secondary | ICD-10-CM | POA: Diagnosis not present

## 2018-07-15 DIAGNOSIS — I129 Hypertensive chronic kidney disease with stage 1 through stage 4 chronic kidney disease, or unspecified chronic kidney disease: Secondary | ICD-10-CM | POA: Diagnosis not present

## 2018-07-15 DIAGNOSIS — E782 Mixed hyperlipidemia: Secondary | ICD-10-CM | POA: Diagnosis not present

## 2018-07-15 DIAGNOSIS — Z0000 Encounter for general adult medical examination without abnormal findings: Secondary | ICD-10-CM | POA: Diagnosis not present

## 2018-07-15 DIAGNOSIS — R05 Cough: Secondary | ICD-10-CM | POA: Diagnosis not present

## 2018-10-06 DIAGNOSIS — J069 Acute upper respiratory infection, unspecified: Secondary | ICD-10-CM | POA: Diagnosis not present

## 2018-10-27 ENCOUNTER — Ambulatory Visit: Payer: 59 | Admitting: Sports Medicine

## 2018-10-27 ENCOUNTER — Encounter: Payer: Self-pay | Admitting: Sports Medicine

## 2018-10-27 ENCOUNTER — Ambulatory Visit: Payer: Self-pay

## 2018-10-27 VITALS — BP 144/73 | Ht 66.0 in | Wt 195.0 lb

## 2018-10-27 DIAGNOSIS — M7711 Lateral epicondylitis, right elbow: Secondary | ICD-10-CM | POA: Diagnosis not present

## 2018-10-27 DIAGNOSIS — M25521 Pain in right elbow: Secondary | ICD-10-CM

## 2018-10-27 MED ORDER — NITROGLYCERIN 0.2 MG/HR TD PT24: MEDICATED_PATCH | TRANSDERMAL | 1 refills | Status: AC

## 2018-10-27 NOTE — Progress Notes (Signed)
   HPI  CC: Right elbow pain  Lauren Vazquez is a 62 year old female who presents for right elbow pain.  She states the pain is been present for around 1 month.  She states she has been playing tennis twice a week until that time.  She states she noticed it most when she did a backhand swing.  She does a two-handed backhand swing.  She is right-hand dominant.  She states she has tried a compression sleeve over the area, as well as a lateral epicondyle brace.  She states this is not helped much.  She also done ice over the area with minimal relief.  She cannot take NSAIDs due to chronic kidney disease.  She has never tried Nitropatch protocol before.  She has no history of migraines or heart disease.  She denies any numbness tingling shooting in her hand.  She denies any weakness in handgrip.  She denies any prior trauma to the area.  She has played tennis most of her life.  See HPI and/or previous note for associated ROS.  Objective: BP (!) 144/73   Ht 5\' 6"  (1.676 m)   Wt 195 lb (88.5 kg)   BMI 31.47 kg/m  Gen: Right-Hand Dominant. NAD, well groomed, a/o x3, normal affect.  CV: Well-perfused. Warm.  Resp: Non-labored.  Neuro: Sensation intact throughout. No gross coordination deficits.  Gait: Nonpathologic posture, unremarkable stride without signs of limp or balance issues.  Right elbow exam: No erythema, warmth, swelling noted.  Tenderness palpation over the lateral epicondyle.  Full range of motion extension flexion of the elbow.  Strength out of 5 throughout all testing.  Pain with resisted wrist extension.  Negative UCL milk test.  ULTRASOUND: Elbow, right Diagnostic limited ultrasound imaging obtained of patient's right elbow. -Lateral epicondyle visualized in the long and short axis.  Spur noted along the lateral epicondyle long axis with an associated avulsion noted.  There is hypoechoic change around this area.  Hypoechoic change confirmed in short axis over the proximal attachment.   Significant fluid noted at the deep attachment of the extensor tendons on the lateral epicondyle. IMPRESSION: findings consistent with lateral epicondylitis with a partial tear.  Abnormalities found, note neovessels:  "  Doppler showed evidence of neovascularization along the areas of hypoechoic change"  Ultrasound and interpretation by Lewanda Rife, MD and Wolfgang Phoenix. Fields, MD    Assessment and plan: Right-sided lateral epicondylitis, with partial tear seen on ultrasound as noted above.  We discussed treatment options at today's visit.  We will start her on the Nitropatch protocol at today's visit.  She will use one fourth of a patch daily over the area for the next 3 months.  We will see for follow-up in 6 weeks and perform a new [place] scan at that time.  I also provided with a series of stretches and strengthening exercises for the extensor tendons of the forearm.  We will also fit her for a body helix compression sleeve today at patient's request.  Lewanda Rife, MD [place] Fellow 10/27/2018 11:39 AM  I observed and examined the patient with the [facility] Fellow and agree with assessment and plan.  Note reviewed and modified by me. Stefanie Libel, MD

## 2018-10-27 NOTE — Patient Instructions (Signed)

## 2018-12-03 ENCOUNTER — Encounter: Payer: Self-pay | Admitting: Sports Medicine

## 2018-12-03 ENCOUNTER — Ambulatory Visit: Payer: 59 | Admitting: Sports Medicine

## 2018-12-03 VITALS — BP 126/82 | Ht 66.0 in | Wt 200.0 lb

## 2018-12-03 DIAGNOSIS — M7711 Lateral epicondylitis, right elbow: Secondary | ICD-10-CM

## 2018-12-03 NOTE — Progress Notes (Signed)
  Lauren Vazquez - 63 y.o. female MRN [id number]  Date of birth: September 13, 1956    SUBJECTIVE:      Chief Complaint:/ HPI:  63 year old female presents for follow-up for right lateral epicondylitis.  Overall she denies any significant improvement although able to play  More with no worsening of the chronic pain.  She has been continue to play tennis during this time but denies any significant symptoms while playing.  She has been wearing a compression elbow sleeve and using nitroglycerin patch.  Right lateral wall pain is most noticeable if extending her arm in awkward positions or at night.  She feels that the compression elbow sleeve has been causing her to have pain in the dorsal aspect of the wrist.  Otherwise, she denies any worsening symptoms.  No numbness or tingling.  No erythema or bruising.  No associated skin changes   ROS:     See HPI  PERTINENT  PMH / PSH FH / / SH:  Past Medical, Surgical, Social, and Family History Reviewed & Updated in the EMR.   OBJECTIVE: BP 126/82   Ht 5\' 6"  (1.676 m)   Wt 200 lb (90.7 kg)   BMI 32.28 kg/m   Physical Exam:  Vital signs are reviewed.  GEN: Alert and oriented, NAD Pulm: Breathing unlabored PSY: normal mood, congruent affect  MSK: Right elbow: No obvious deformity, swelling, or erythema Mild tenderness over the lateral epicondyle. Full range of motion with elbow extension and flexion, supination and pronation 5/5 strength.  Pain reproduced with resisted wrist extension and ECRB testing. Positive book sign  MSK [place]: Limited ultrasound of the lateral elbow shows market improvement in the lateral common extensor tendon.  There does remain a small area of hypoechoic change at the deep insertional fibers.  This is significantly improved compared to the area of swelling/tear seen on ultrasound previously.  There continues to remain prominent neo-vessels.  Impression: interval improvement in lateral epicondylitis based on Ultrasound  findings.  Ultrasound and interpretation by Coralyn Helling, DO and Wolfgang Phoenix. Lauren Vitali, MD   ASSESSMENT & PLAN:  1.  Right lateral epicondylitis-symptomatically she has remained somewhat stable but has been able to continue to play tennis.  Objectively on ultrasound she has shown significant improvement in regards to the tendon tear - Continue nitroglycerin patches -Continue home exercises with exception of hammer rotations -We will transition her from the compression elbow sleeve to a counterforce brace.  Her symptoms sound as though there may be some compression of the radial nerve when she wears the elbow sleeve. -Follow-up in 6 weeks

## 2018-12-03 NOTE — Patient Instructions (Signed)
Today we reevaluated your lateral epicondylitis. On ultrasound there is been significant improvement We will have you continue the nitroglycerin patches for additional 6 weeks. Continue the exercises, however avoid doing the rotational exercises with a hammer The pain you are experiencing in her wrist may be due to irritation/compression of a nerve.  We will have you go back to the counterforce brace (the bubble brace) with activity. Overall, you are making good progress.  We will see you back in 6 weeks.

## 2019-01-28 ENCOUNTER — Other Ambulatory Visit: Payer: Self-pay

## 2019-01-28 ENCOUNTER — Encounter: Payer: Self-pay | Admitting: Sports Medicine

## 2019-01-28 ENCOUNTER — Ambulatory Visit (INDEPENDENT_AMBULATORY_CARE_PROVIDER_SITE_OTHER): Payer: 59 | Admitting: Sports Medicine

## 2019-01-28 VITALS — BP 136/86 | Ht 66.0 in | Wt 200.0 lb

## 2019-01-28 DIAGNOSIS — M7711 Lateral epicondylitis, right elbow: Secondary | ICD-10-CM | POA: Diagnosis not present

## 2019-01-28 NOTE — Progress Notes (Signed)
  Lauren Vazquez - 63 y.o. female MRN [id number]  Date of birth: February 04, 1956    SUBJECTIVE:      Chief Complaint: Right elbow pain  HPI:  63 year old female here for follow-up for right lateral epicondylitis.  She notes some continued pain but overall is feeling better.  She is using topical nitroglycerin.  She is wearing a counterforce brace when she plays tennis, she has modified location of where she wears this to eliminate some of the radial tunnel symptoms she was experiencing..  Otherwise, she is not using any pain medications.  She notes some symptoms with playing tennis.  She also primarily has symptoms when her arm is outstretched to pick up an object with her hand pronated.  .  The previous dorsal wrist symptoms she was experiencing have now improved after she stopped wearing a compression elbow sleeve.  She denies any new swelling or bruising.  No erythema.  No numbness or tingling distally.  No skin changes   ROS:     See HPI  PERTINENT  PMH / PSH FH / / SH:  Past Medical, Surgical, Social, and Family History Reviewed & Updated in the EMR.    OBJECTIVE: BP 136/86   Ht 5\' 6"  (1.676 m)   Wt 200 lb (90.7 kg)   BMI 32.28 kg/m   Physical Exam:  Vital signs are reviewed.  GEN: Alert and oriented, NAD Pulm: Breathing unlabored PSY: normal mood, congruent affect  MSK: Right elbow: - Inspection: no obvious deformity. No swelling, erythema or bruising - Palpation: Tenderness at the lateral epicondyle - ROM: full active ROM in flexion and extension. No crepitus - Strength: 5/5 strength in wrist flexion and extension.  Pain with resisted extension. 5/5 strength in biceps, triceps. - Neuro: NV intact distally - Special testing: No pain with resisted ECRB or supination.  Pain with book extension test  MSK [place]: Ultrasound of the lateral right elbow.  The common extensor tendon was visualized to its insertion.  There is a traction spur visible.  This was also seen on previous  imaging.  There is slight hypoechoic change of the tendon near its insertion.  This is also improved compared to previous.  There is significant decrease in the neovascularity within the tendon.  Ultrasound and interpretation by Coralyn Helling DO and [place] Fields, MD   ASSESSMENT & PLAN:  1.  Right lateral elbow pain secondary to lateral epicondylitis- overall she is making good improvement continuing she has continued to be active and play tennis throughout treatment.  Her ultrasound is reassuring and is showing improvement. - Continue nitroglycerin patches -Continue to wear counterforce brace - Discussed rehab exercises to continue - Follow-up 6 to 8 weeks

## 2019-02-01 DIAGNOSIS — H2513 Age-related nuclear cataract, bilateral: Secondary | ICD-10-CM | POA: Diagnosis not present

## 2019-02-01 DIAGNOSIS — H40013 Open angle with borderline findings, low risk, bilateral: Secondary | ICD-10-CM | POA: Diagnosis not present

## 2019-02-01 DIAGNOSIS — H02831 Dermatochalasis of right upper eyelid: Secondary | ICD-10-CM | POA: Diagnosis not present

## 2019-09-06 ENCOUNTER — Other Ambulatory Visit: Payer: Self-pay | Admitting: Obstetrics and Gynecology

## 2019-09-06 DIAGNOSIS — Z1231 Encounter for screening mammogram for malignant neoplasm of breast: Secondary | ICD-10-CM

## 2019-10-22 ENCOUNTER — Ambulatory Visit: Payer: 59

## 2019-11-17 ENCOUNTER — Ambulatory Visit
Admission: RE | Admit: 2019-11-17 | Discharge: 2019-11-17 | Disposition: A | Discharge status: Home / Self Care | Payer: 59 | Source: Ambulatory Visit | Attending: Obstetrics and Gynecology | Admitting: Obstetrics and Gynecology

## 2019-11-17 ENCOUNTER — Other Ambulatory Visit: Payer: Self-pay

## 2019-11-17 DIAGNOSIS — Z1231 Encounter for screening mammogram for malignant neoplasm of breast: Secondary | ICD-10-CM

## 2021-01-31 ENCOUNTER — Other Ambulatory Visit: Payer: Self-pay | Admitting: Obstetrics and Gynecology

## 2021-01-31 DIAGNOSIS — Z1231 Encounter for screening mammogram for malignant neoplasm of breast: Secondary | ICD-10-CM

## 2021-04-02 ENCOUNTER — Other Ambulatory Visit: Payer: Self-pay | Admitting: Obstetrics

## 2021-04-02 ENCOUNTER — Other Ambulatory Visit: Payer: Self-pay

## 2021-04-02 ENCOUNTER — Ambulatory Visit
Admission: RE | Admit: 2021-04-02 | Discharge: 2021-04-02 | Disposition: A | Discharge status: Home / Self Care | Payer: 59 | Source: Ambulatory Visit | Attending: Obstetrics and Gynecology | Admitting: Obstetrics and Gynecology

## 2021-04-02 DIAGNOSIS — Z1231 Encounter for screening mammogram for malignant neoplasm of breast: Secondary | ICD-10-CM

## 2021-06-01 ENCOUNTER — Other Ambulatory Visit: Payer: Self-pay | Admitting: Family Medicine

## 2021-06-01 DIAGNOSIS — I2510 Atherosclerotic heart disease of native coronary artery without angina pectoris: Secondary | ICD-10-CM

## 2021-07-10 ENCOUNTER — Ambulatory Visit
Admission: RE | Admit: 2021-07-10 | Discharge: 2021-07-10 | Disposition: A | Discharge status: Home / Self Care | Payer: No Typology Code available for payment source | Source: Ambulatory Visit | Attending: Family Medicine | Admitting: Family Medicine

## 2021-07-10 DIAGNOSIS — I2510 Atherosclerotic heart disease of native coronary artery without angina pectoris: Secondary | ICD-10-CM

## 2022-11-20 ENCOUNTER — Other Ambulatory Visit: Payer: Self-pay | Admitting: Family Medicine

## 2022-11-20 DIAGNOSIS — Z1231 Encounter for screening mammogram for malignant neoplasm of breast: Secondary | ICD-10-CM

## 2022-12-31 IMAGING — CT CT CARDIAC CORONARY ARTERY CALCIUM SCORE
3 series · 12 of 20 positions shown, 14 images · non-contrast
Comparison: None.

CLINICAL DATA: 65-year-old white female with atherosclerotic
cardiovascular disease.

EXAM:
CT CARDIAC CORONARY ARTERY CALCIUM SCORE
TECHNIQUE: Non-contrast imaging through the heart was performed using
prospective ECG gating. Image post processing was performed on an
independent workstation, allowing for quantitative analysis of the
heart and coronary arteries. Note that this exam targets the heart
and the chest was not imaged in its entirety.

[Series 2: calcium scoring 2.00 qr36 bestdiast 70% hrt calciu · axial · 0.33mm/px · z∈[+1626,+1650]mm · 2 of 60 slices shown]
[im 12/60  vessel]
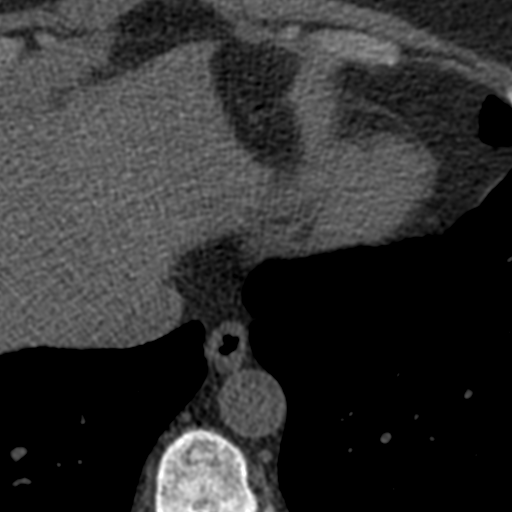
[im 24/60  vessel]
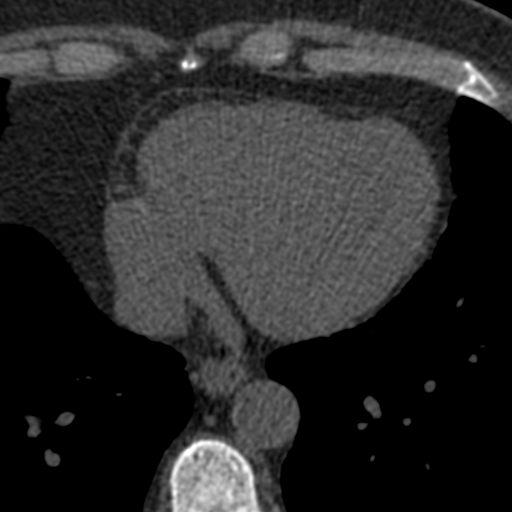

[Series 3: calcium scoring 2.00 br40 bestdiast 70% axial · axial · 0.58mm/px · z∈[+1622,+1702]mm · 5 of 60 slices shown, 7 images]
[im 10/60  vessel]
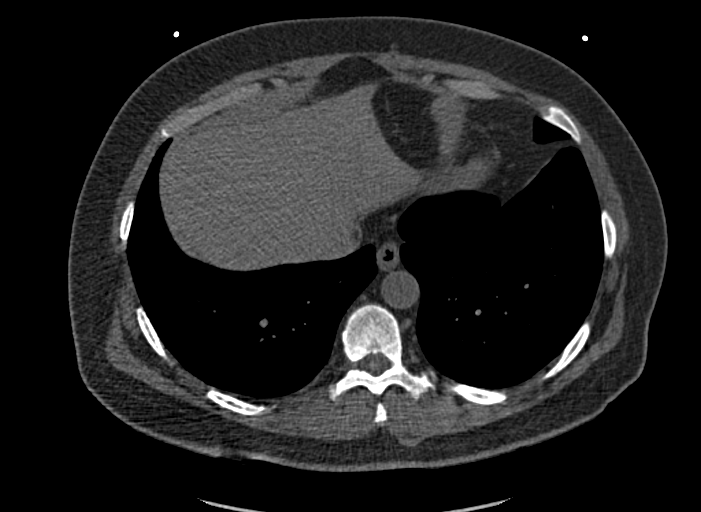
[im 10/60  lung]
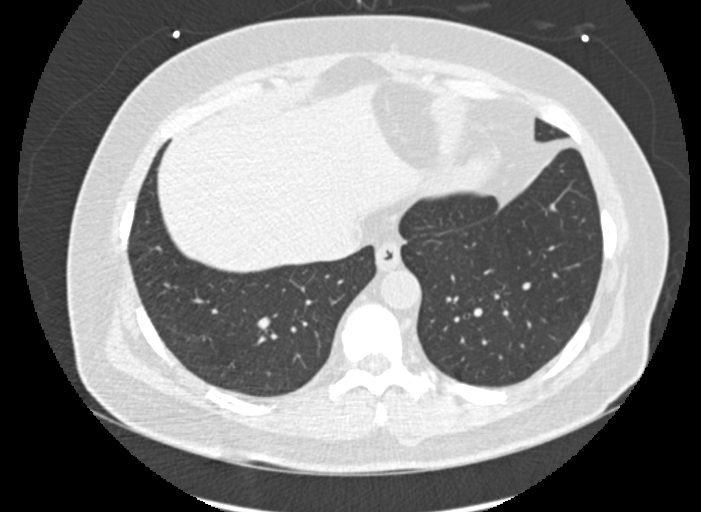
[im 20/60  vessel]
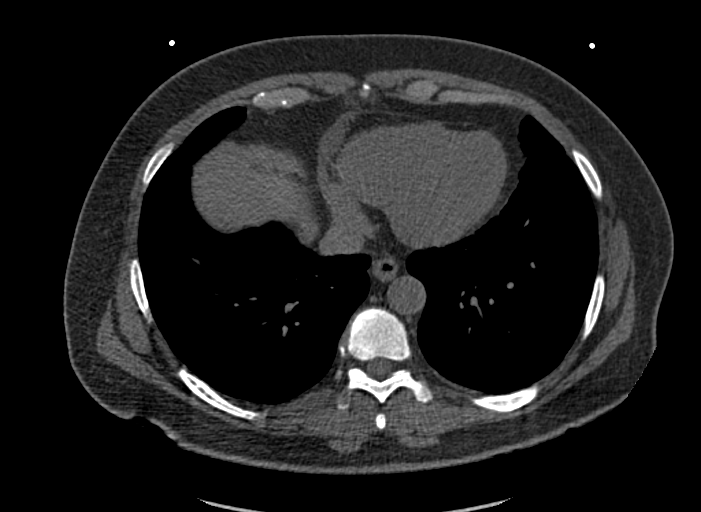
[im 30/60  vessel]
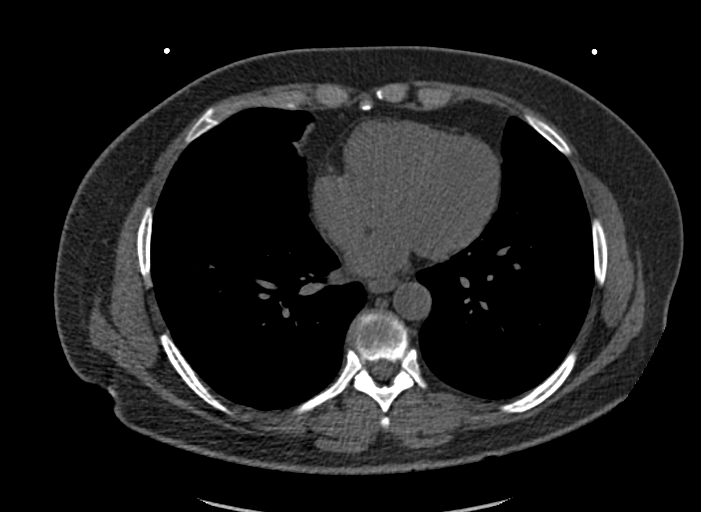
[im 40/60  vessel]
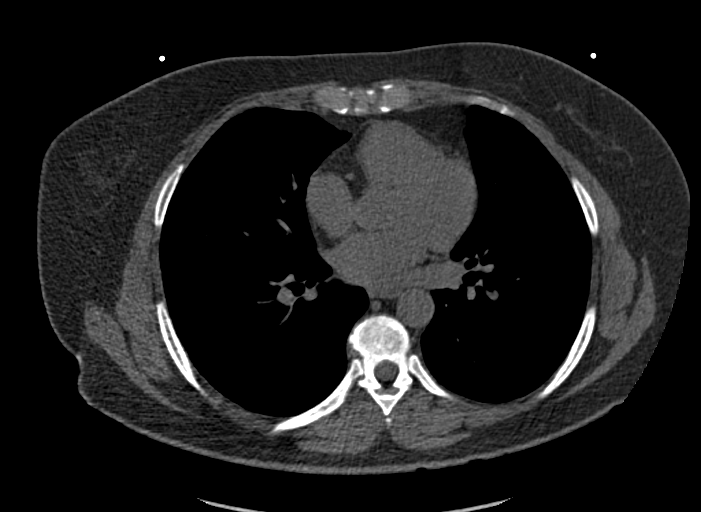
[im 50/60  vessel]
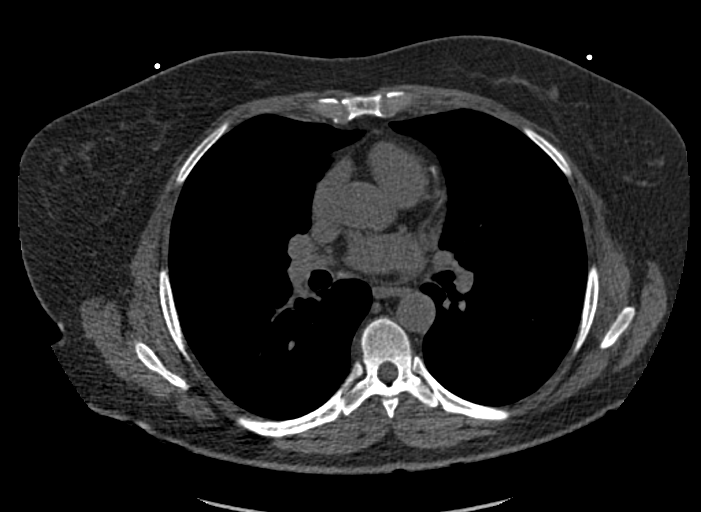
[im 50/60  lung]
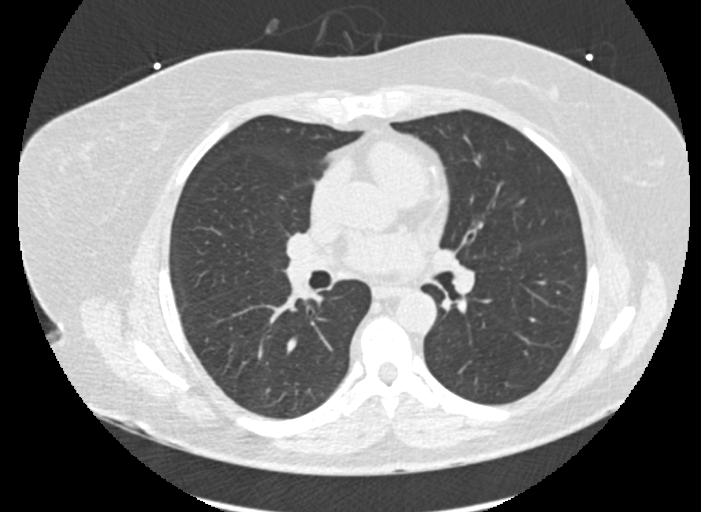

[Series 9: calcium scoring 2.00 br60 bestdiast 70% lungs · axial · 0.58mm/px · z∈[+1622,+1702]mm · 5 of 60 slices shown]
[im 10/60  vessel]
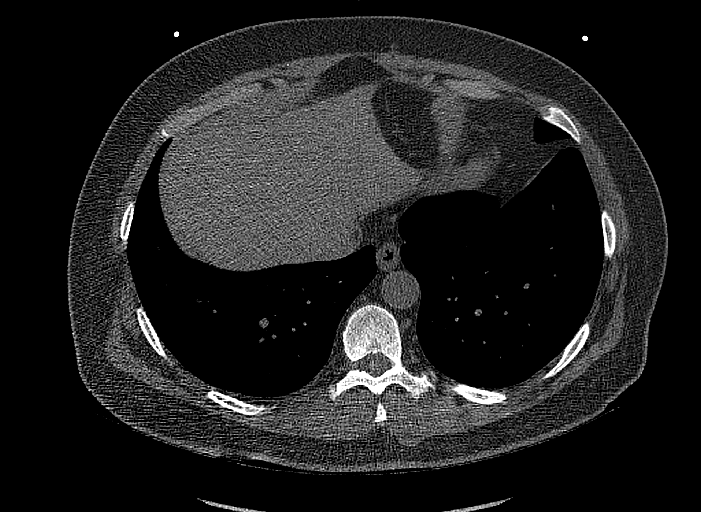
[im 20/60  vessel]
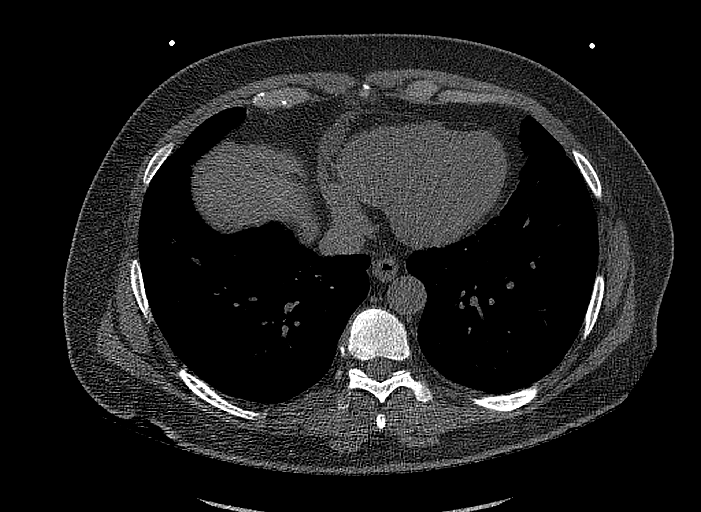
[im 30/60  vessel]
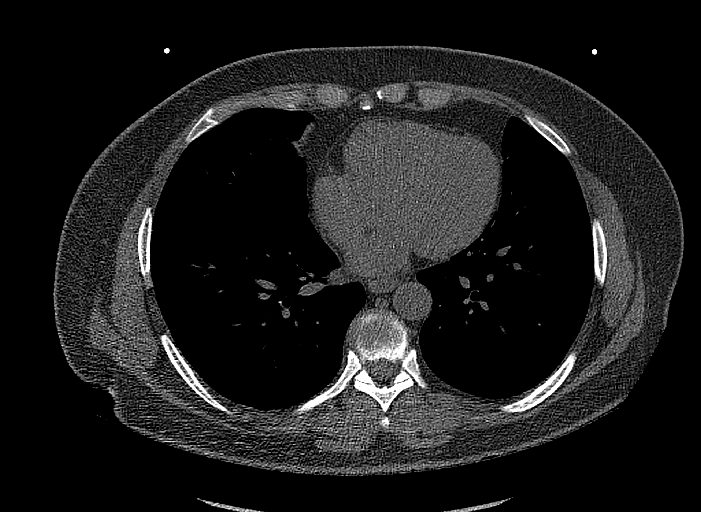
[im 40/60  vessel]
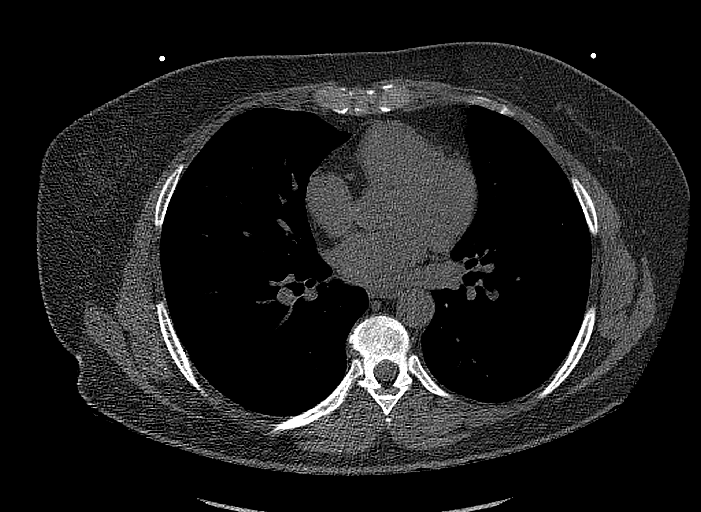
[im 50/60  vessel]
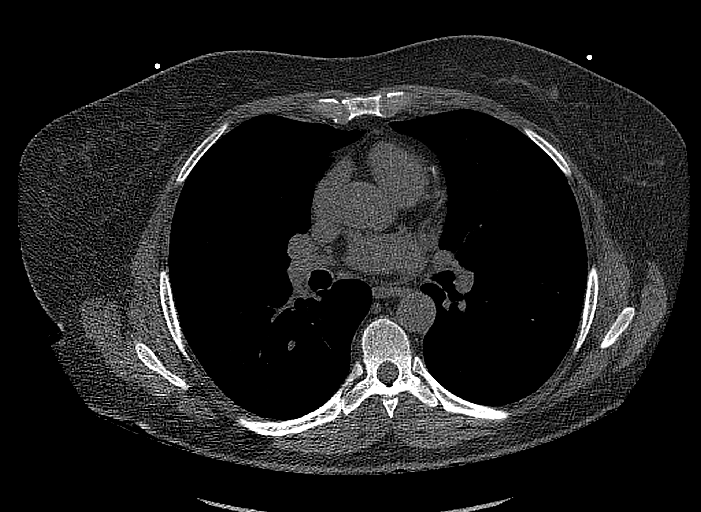

[12 of 20 positions shown; findings below may reference images not displayed]

FINDINGS: CORONARY CALCIUM SCORES:

Left Main: 0

LAD: 108

LCx:

RCA: 0

Total Agatston Score: 119

[HOSPITAL] percentile: 85

AORTA MEASUREMENTS:

Ascending Aorta: 27 mm

Descending Aorta: 23 mm

OTHER FINDINGS:

Right hilar calcifications are compatible with old granulomatous
disease. Small calcifications in the subcarinal region. Heart size
is normal with a small amount of pericardial fluid. Images of upper
abdomen are unremarkable. 6 mm calcified granuloma in the right
upper lobe. No large pleural effusions. No significant airspace
disease or consolidation in the lungs. Few tiny peripheral nodular
densities largest measuring 2 mm in the right lower lobe on sequence
9 image 54. No acute bone abnormality.
IMPRESSION: 1. Coronary calcium score is 119 and this is at percentile 85 for
patients of the same age, gender and ethnicity.
2. Few tiny peripheral nodular densities, largest measuring 2 mm.
These are likely incidental findings but indeterminate. No follow-up
needed if patient is low-risk (and has no known or suspected primary
neoplasm). Non-contrast chest CT can be considered in 12 months if
patient is high-risk. This recommendation follows the consensus
statement: Guidelines for Management of Incidental Pulmonary Nodules
Detected on CT Images: From the [HOSPITAL] 7517; Radiology
3. Evidence for old granulomatous disease.

## 2023-01-13 ENCOUNTER — Ambulatory Visit
Admission: RE | Admit: 2023-01-13 | Discharge: 2023-01-13 | Disposition: A | Discharge status: Home / Self Care | Payer: Medicare Other | Source: Ambulatory Visit | Attending: Family Medicine | Admitting: Family Medicine

## 2023-01-13 DIAGNOSIS — Z1231 Encounter for screening mammogram for malignant neoplasm of breast: Secondary | ICD-10-CM

## 2023-02-27 ENCOUNTER — Other Ambulatory Visit: Payer: Self-pay

## 2023-02-27 ENCOUNTER — Ambulatory Visit: Payer: Medicare Other | Admitting: Sports Medicine

## 2023-02-27 VITALS — BP 134/70 | Ht 66.0 in | Wt 195.0 lb

## 2023-02-27 DIAGNOSIS — M79672 Pain in left foot: Secondary | ICD-10-CM | POA: Diagnosis not present

## 2023-02-27 NOTE — Progress Notes (Signed)
  Lauren Vazquez - 67 y.o. female MRN [id number]  Date of birth: January 04, 1956    CHIEF COMPLAINT:   L Heel pain    SUBJECTIVE:   HPI:  Pleasant 67 year old female comes to clinic to be evaluated for left heel pain.  This is been present for about a month.  She has sharp shooting pain on the medial aspect of her left heel.  It is made worse after playing tennis.  She denies any inciting injury or feeling a pop in the heel.  Has not tried taking medicines.  Has not done any stretches.  She has tried getting compression socks, sleep sock, and inserts for shoes but is not found any relief from any of this yet.  ROS:     See HPI  PERTINENT  PMH / PSH FH / / SH:  Past Medical, Surgical, Social, and Family History Reviewed & Updated in the EMR.  Pertinent findings include:  none  OBJECTIVE: BP 134/70   Ht 5\' 6"  (1.676 m)   Wt 195 lb (88.5 kg)   BMI 31.47 kg/m   Physical Exam:  Vital signs are reviewed.  GEN: Alert and oriented, NAD Pulm: Breathing unlabored PSY: normal mood, congruent affect  MSK: L foot -no obvious deformity.  No erythema.  No ecchymoses.  She is very tender to palpation at the medial aspect of the calcaneus.  Nontender to palpation of the metatarsal heads.  Full range of motion ankle in all directions.  5/5 strength throughout.  Negative anterior drawer.  Negative talar tilt.  Positive windlass test.  Ultrasound: L Heel  Plantar Fascia -there are some thickening and hypoechoic changes of the plantar fascia near the insertion of the calcaneus.  The plantar fascia measures 0.46 cm just distal to the calcaneus.  It measures 0.55 at the area of maximal swelling.  Also small bone spur.  Impression: Thickening and swelling at the base of the calcaneus near the plantar fascia insertion consistent with heel contusion  Interpreted by Dr. Jordan Hawks and Dr. Darrick Penna on 02/27/2023  ASSESSMENT & PLAN:  1.  Left heel contusion  -Will treat the patient conservatively with green  sport insoles and a heel cup to provide extra padding to cushion her heel.  Also gave her home exercises focusing on plantar fascia stretching.  Advised take relative rest from tennis.  Will check on her progress in 4 weeks.  If she feels better at the heel but is still having pain in the plantar fascia, she may be a candidate for shockwave.  I would not do shockwave directly over the calcaneus.  Arvella Nigh, MD PGY-4, Sports Medicine Fellow [facility]  I observed and examined the patient with the [facility] resident and agree with assessment and plan.  Note reviewed and modified by me. Sterling Big, MD

## 2023-04-03 ENCOUNTER — Ambulatory Visit: Payer: Medicare Other | Admitting: Sports Medicine

## 2023-12-03 ENCOUNTER — Other Ambulatory Visit: Payer: Self-pay | Admitting: Family Medicine

## 2023-12-03 DIAGNOSIS — E2839 Other primary ovarian failure: Secondary | ICD-10-CM

## 2023-12-09 ENCOUNTER — Other Ambulatory Visit: Payer: Self-pay | Admitting: Family Medicine

## 2023-12-09 DIAGNOSIS — Z1231 Encounter for screening mammogram for malignant neoplasm of breast: Secondary | ICD-10-CM

## 2024-01-16 ENCOUNTER — Ambulatory Visit: Payer: Medicare Other

## 2024-02-06 ENCOUNTER — Ambulatory Visit: Payer: Medicare Other

## 2024-02-20 ENCOUNTER — Ambulatory Visit
Admission: RE | Admit: 2024-02-20 | Discharge: 2024-02-20 | Disposition: A | Discharge status: Home / Self Care | Source: Ambulatory Visit | Attending: Family Medicine | Admitting: Family Medicine

## 2024-02-20 DIAGNOSIS — Z1231 Encounter for screening mammogram for malignant neoplasm of breast: Secondary | ICD-10-CM

## 2024-06-10 ENCOUNTER — Ambulatory Visit: Admitting: Sports Medicine

## 2024-06-10 ENCOUNTER — Other Ambulatory Visit: Payer: Self-pay

## 2024-06-10 ENCOUNTER — Encounter: Payer: Self-pay | Admitting: Sports Medicine

## 2024-06-10 VITALS — BP 140/80 | Ht 66.0 in | Wt 195.0 lb

## 2024-06-10 DIAGNOSIS — M79645 Pain in left finger(s): Secondary | ICD-10-CM | POA: Diagnosis not present

## 2024-06-10 DIAGNOSIS — M79672 Pain in left foot: Secondary | ICD-10-CM | POA: Diagnosis not present

## 2024-06-10 NOTE — Progress Notes (Addendum)
 PCP: Sun, Vyvyan, MD  Subjective:   HPI: Patient is a 68 y.o. female here for dorsal left thumb pain.  She endorses onset of sharp pain approximately 1 month ago after she was lifting a heavy bag.  Pain is intermittently present with activities such as attempting to hold her phone with her left thumb, pulling on the belt loops on her pants, or making abrupt lateral movements with her left hand.  She has been using a thumb splint, which has been helpful, but has not noted any improvement in her pain.  Her additional concern today is left heel pain.  This typically develops after intense activity, such as running or playing tennis.  She has a history of plantar fasciitis but states that her current discomfort does not feel the same in pattern.  Past Medical History:  Diagnosis Date   Abnormal screening mammogram    Anemia    Fibroids    history of   Hypertension    Rectal fissure     Current Outpatient Medications on File Prior to Visit  Medication Sig Dispense Refill   irbesartan-hydrochlorothiazide (AVALIDE) 150-12.5 MG tablet 1 TABLET EVERY MORNING BY MOUTH 90 DAYS  2   nitroGLYCERIN  (NITRODUR - DOSED IN MG/24 HR) 0.2 mg/hr patch Use 1/4 patch daily to the affected area. 30 patch 1   No current facility-administered medications on file prior to visit.    Past Surgical History:  Procedure Laterality Date   DILATION AND CURETTAGE OF UTERUS      Allergies  Allergen Reactions   Amitriptyline  Other (See Comments)    dreams   Bee Venom    Lisinopril Cough   Poison Ivy Extract [Poison Ivy Extract]     BP (!) 140/80 (Patient Position: Sitting)   Ht 5' 6 (1.676 m)   Wt 195 lb (88.5 kg)   BMI 31.47 kg/m       No data to display              No data to display              Objective:  Physical Exam:  Gen: NAD, comfortable in exam room  Left thumb No obvious deformity on inspection No TTP at the [facility] or first MCP Full range of motion at both the [facility] and first  MCP 5/5 strength No pain is elicited with resisted flexion, extension, abduction, or abduction Negative Finkelstein's  Left Heel / Ankle No obvious deformity Mild TTP along the medial and lateral sides of the Achilles tendon at the site of calcaneal insertion Full range of motion of the left ankle Negative Thompson's and calcaneal squeeze  Limited MSK Ultrasound of Left Thumb There is spurring with evidence of calcification present at the first metacarpophalangeal joint.  Normal appearance of the carpometacarpal joint.  Normal appearance of the first dorsal wrist compartment.  Impression; Small spur at first MTP right hand  Limited MSK Ultrasound of Left Heel There is no evidence of Achilles tendon rupture.  Mucoid degeneration is noted along the Achilles tendon at the site of calcaneal insertion.  Impression; mild chronic Achilles tendinopathy with a thickness of 0.66  Ultrasound and interpretation by Helene NOVAK. Harvey, MD and Manus Fireman, MD.   Assessment & Plan:  1.  Left thumb pain Present for the last month and starting after she attempted to lift a heavy bag.  Pain is intermittently present and seems to be elicited by actions that place significant weight on her thumb, forcing it  into extension.  She provides examples of attempting to hold her phone with her left thumb and pulling on her belt loops.  Limited ultrasound today shows spurring and evidence of calcification at the first MCP.  Treatment options reviewed.  Recommend continued use of her current thumb brace.  Continue as needed use of NSAIDs and icing for pain relief.  Will arrange follow-up in 1 month for reassessment.  Consider shockwave therapy if pain is not improving.  2. Left Heel Pain Intermittent left heel pain worse with intense activity.  Mucoid degeneration is noted along the Achilles tendon at the site of calcaneal insertion on ultrasound.  Recommend starting PT exercises aimed at strengthening her calves.  These  were provided today.  Will also provide heel pads for comfort.  Follow-up in 1 month for reassessment.

## 2024-08-03 ENCOUNTER — Other Ambulatory Visit: Payer: Medicare Other

## 2024-11-30 ENCOUNTER — Ambulatory Visit: Admitting: Sports Medicine

## 2024-12-28 ENCOUNTER — Ambulatory Visit: Admitting: Sports Medicine

## 2025-01-11 ENCOUNTER — Other Ambulatory Visit (HOSPITAL_BASED_OUTPATIENT_CLINIC_OR_DEPARTMENT_OTHER)
